# Patient Record
Sex: Female | Born: 1950 | Race: Black or African American | Hispanic: No | Smoking: Never smoker
Health system: Southern US, Community
[De-identification: ages and names within clinical notes are randomized; demographics above are authoritative.]

## PROBLEM LIST (undated history)

## (undated) DIAGNOSIS — I10 Essential (primary) hypertension: Secondary | ICD-10-CM

## (undated) DIAGNOSIS — E119 Type 2 diabetes mellitus without complications: Secondary | ICD-10-CM

## (undated) DIAGNOSIS — I509 Heart failure, unspecified: Secondary | ICD-10-CM

## (undated) HISTORY — PX: CARPAL TUNNEL RELEASE: SHX101

---

## 2022-03-10 ENCOUNTER — Other Ambulatory Visit: Payer: Self-pay

## 2022-03-10 ENCOUNTER — Emergency Department (HOSPITAL_BASED_OUTPATIENT_CLINIC_OR_DEPARTMENT_OTHER): Payer: Medicare HMO

## 2022-03-10 ENCOUNTER — Encounter (HOSPITAL_BASED_OUTPATIENT_CLINIC_OR_DEPARTMENT_OTHER): Payer: Self-pay | Admitting: Emergency Medicine

## 2022-03-10 ENCOUNTER — Emergency Department (HOSPITAL_BASED_OUTPATIENT_CLINIC_OR_DEPARTMENT_OTHER)
Admission: EM | Admit: 2022-03-10 | Discharge: 2022-03-10 | Disposition: A | Discharge status: Home / Self Care | Payer: Medicare HMO | Attending: Emergency Medicine | Admitting: Emergency Medicine

## 2022-03-10 DIAGNOSIS — S72001A Fracture of unspecified part of neck of right femur, initial encounter for closed fracture: Secondary | ICD-10-CM | POA: Insufficient documentation

## 2022-03-10 DIAGNOSIS — E119 Type 2 diabetes mellitus without complications: Secondary | ICD-10-CM | POA: Insufficient documentation

## 2022-03-10 DIAGNOSIS — S79911A Unspecified injury of right hip, initial encounter: Secondary | ICD-10-CM | POA: Diagnosis present

## 2022-03-10 DIAGNOSIS — Z950 Presence of cardiac pacemaker: Secondary | ICD-10-CM | POA: Diagnosis not present

## 2022-03-10 DIAGNOSIS — W07XXXA Fall from chair, initial encounter: Secondary | ICD-10-CM | POA: Insufficient documentation

## 2022-03-10 DIAGNOSIS — I10 Essential (primary) hypertension: Secondary | ICD-10-CM | POA: Diagnosis not present

## 2022-03-10 NOTE — Discharge Instructions (Signed)
You were seen in the emergency department for right hip pain after a fall.  You had x-rays and a CAT scan of your right hip.  There was a fracture off of the corner of the hip bone.  Please follow-up with Dr. Ophelia Charter orthopedist.  He asked that you use a walker when ambulating so you do not put as much stress on the area.  Continue the pain medication as needed.  Return if any worsening or concerning symptoms. ?

## 2022-03-10 NOTE — ED Triage Notes (Signed)
Pt states fell April 3, pain right hip since then, intermittent.  Ambulated to room with steady gait.  Pt reports taking tylenol and meloxicam and gabapentin.  Last took tylenol at 0700.   ?

## 2022-03-10 NOTE — ED Notes (Signed)
ED Provider at bedside. 

## 2022-03-10 NOTE — ED Provider Notes (Signed)
?[facility] EMERGENCY DEPARTMENT ?Provider Note ? ? ?CSN: [id number] ?Arrival date & time: 03/10/22  1232 ? ?  ? ?History ? ?Chief Complaint  ?Patient presents with  ? Hip Pain  ?  fall  ? ? ?Geroge BasemanMinnie Carlson is a 71 y.o. female.  She has a history of diabetes hypertension sick sinus syndrome with pacemaker.  She said she had a mechanical fall 11 days ago off a chair landing on her right hip.  It did not bother her initially but progressively has been more painful.  Worse with going from sitting to a standing position.  Taking over-the-counter pain medication without improvement.  No numbness or weakness.  No knee or ankle pain.  No leg swelling. ? ?The history is provided by the patient.  ?Hip Pain ?This is a new problem. The current episode started more than 1 week ago. The problem occurs daily. The problem has been gradually worsening. Pertinent negatives include no chest pain, no abdominal pain and no shortness of breath. The symptoms are aggravated by bending and walking. Nothing relieves the symptoms. She has tried rest for the symptoms. The treatment provided no relief.  ? ?  ? ?Home Medications ?Prior to Admission medications   ?Not on File  ?   ? ?Allergies    ?Patient has no known allergies.   ? ?Review of Systems   ?Review of Systems  ?Constitutional:  Negative for fever.  ?HENT:  Negative for sore throat.   ?Respiratory:  Negative for shortness of breath.   ?Cardiovascular:  Negative for chest pain.  ?Gastrointestinal:  Negative for abdominal pain.  ?Genitourinary:  Negative for dysuria.  ?Musculoskeletal:  Negative for neck pain.  ?Skin:  Negative for rash.  ?Neurological:  Negative for weakness and numbness.  ? ?Physical Exam ?Updated Vital Signs ?BP 131/85 (BP Location: Right Arm)   Pulse 91   Temp 98.2 ?F (36.8 ?C) (Oral)   Resp (!) 21   Wt 122.9 kg   SpO2 98%  ?Physical Exam ?Constitutional:   ?   Appearance: Normal appearance. She is well-developed.  ?HENT:  ?   Head: Normocephalic and  atraumatic.  ?Eyes:  ?   Conjunctiva/sclera: Conjunctivae normal.  ?Abdominal:  ?   Tenderness: There is no abdominal tenderness. There is no guarding or rebound.  ?Musculoskeletal:     ?   General: Tenderness present. No deformity.  ?   Cervical back: Neck supple.  ?   Right lower leg: No edema.  ?   Left lower leg: No edema.  ?   Comments: She has some tenderness and pain with range of motion of her right hip.  Knee and ankle nontender.  Distal neurovascular intact.  No shortening or rotation appreciated  ?Skin: ?   General: Skin is warm and dry.  ?Neurological:  ?   General: No focal deficit present.  ?   Mental Status: She is alert.  ?   GCS: GCS eye subscore is 4. GCS verbal subscore is 5. GCS motor subscore is 6.  ?   Sensory: No sensory deficit.  ?   Motor: No weakness.  ? ? ?ED Results / Procedures / Treatments   ?Labs ?(all labs ordered are listed, but only abnormal results are displayed) ?Labs Reviewed - No data to display ? ?EKG ?None ? ?Radiology ?CT Hip Right Wo Contrast ? ?Result Date: 03/10/2022 ?CLINICAL DATA:  Intermittent hip pain since falling 11 days ago. Hip fracture suspected. EXAM: CT OF THE RIGHT HIP WITHOUT  CONTRAST TECHNIQUE: Multidetector CT imaging of the right hip was performed according to the standard protocol. Multiplanar CT image reconstructions were also generated. RADIATION DOSE REDUCTION: This exam was performed according to the departmental dose-optimization program which includes automated exposure control, adjustment of the mA and/or kV according to patient size and/or use of iterative reconstruction technique. COMPARISON:  Radiographs same date.  Abdominopelvic CT 02/16/2015. FINDINGS: Bones/Joint/Cartilage The bones are well mineralized. There is evidence of a subtle nondisplaced fracture through base of the greater trochanter, best seen on the coronal and sagittal images. There is no definite extension of this fracture across the femoral neck or intertrochanteric region.  There is no dislocation. There are mild to moderate right hip degenerative changes with fragmented osteophytes. No significant hip joint effusion. Moderate degenerative changes at the visualized right sacroiliac joint. Ligaments Suboptimally assessed by CT. Muscles and Tendons No acute findings. Mild atrophy of the right gluteus musculature. No gross tendon abnormality identified. There are chronic appearing calcifications within the soft tissues adjacent to the anterior superior iliac spine. Soft tissues No evidence of periarticular hematoma, other fluid collection, foreign body or soft tissue emphysema. Mild femoral atherosclerosis. IMPRESSION: 1. Evidence of nondisplaced fracture through the base of the right greater trochanter. 2. No fracture extension across the femoral neck or intertrochanteric region identified. 3. Underlying right hip and sacroiliac degenerative changes. 4. No acute soft tissue findings. Electronically Signed   By: Carey Bullocks M.D.   On: 03/10/2022 13:51  ? ?DG Hip Unilat With Pelvis 2-3 Views Right ? ?Result Date: 03/10/2022 ?CLINICAL DATA:  Fall.  Pain.  Fall 02/27/2022.  Right hip pain. EXAM: DG HIP (WITH OR WITHOUT PELVIS) 2-3V RIGHT COMPARISON:  None available FINDINGS: There is mildly decreased bone mineralization. Mild bilateral sacroiliac joint subchondral sclerosis degenerative change right-greater-than-left. Mild bilateral femoroacetabular joint space narrowing. Moderate bilateral acetabular peripheral degenerative osteophytosis. No acute fracture is seen. No dislocation. There is linear ossification just inferior to the left femoral head-neck junction, possibly overlying the iliopsoas tendon but nonspecific. 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 No definite acute fracture is seen. Electronically Signed   By: Neita Garnet M.D.   On: 03/10/2022 13:17   ? ?Procedures ?Procedures  ? ? ?Medications Ordered in ED ?Medications - No data to  display ? ?ED Course/ Medical Decision Making/ A&P ?Clinical Course as of 03/10/22 1835  ?Fri Mar 10, 2022  ?1321 X-ray does not show any acute fracture.  Awaiting radiology reading. [MB]  ?1402 Discussed with Dr. Ophelia Charter.  He said she should probably use a walker or other protective weightbearing and follow-up in the office in a week. [MB]  ?[id number] Patient states she has a walker already.  She understands to use it and follow-up with orthopedics. [MB]  ?  ?Clinical Course User Index ?[MB] Terrilee Files, MD  ? ?                        ?Medical Decision Making ?Amount and/or Complexity of Data Reviewed ?Radiology: ordered. ? ? ?Differential diagnosis includes fracture, dislocation, contusion, tendinitis, bursitis.  X-ray does not show any acute fracture or dislocation.  Ordered CT and they do comment upon greater trochanter fracture nondisplaced.  Reviewed with orthopedics.  Patient comfortable plan for using her walker limited weightbearing and outpatient follow-up with Ortho.  Return instructions discussed. ? ? ? ? ? ? ? ?Final Clinical Impression(s) / ED Diagnoses ?Final diagnoses:  ?Closed right hip fracture, initial encounter (HCC)  ? ? ?  Rx / DC Orders ?ED Discharge Orders   ? ? None  ? ?  ? ? ?  ?Terrilee Files, MD ?03/10/22 1837 ? ?

## 2022-03-17 ENCOUNTER — Ambulatory Visit: Payer: Self-pay

## 2022-03-17 ENCOUNTER — Encounter: Payer: Self-pay | Admitting: Surgery

## 2022-03-17 ENCOUNTER — Ambulatory Visit (INDEPENDENT_AMBULATORY_CARE_PROVIDER_SITE_OTHER): Payer: Medicare HMO | Admitting: Surgery

## 2022-03-17 VITALS — BP 134/82 | HR 88 | Ht 65.0 in | Wt 271.0 lb

## 2022-03-17 DIAGNOSIS — S72114A Nondisplaced fracture of greater trochanter of right femur, initial encounter for closed fracture: Secondary | ICD-10-CM

## 2022-03-17 DIAGNOSIS — M25551 Pain in right hip: Secondary | ICD-10-CM

## 2022-03-17 NOTE — Progress Notes (Signed)
   Office Visit Note   Patient: Michele Carlson           Date of Birth: 18-Aug-1951           MRN: [id number] Visit Date: 03/17/2022              Requested by: Chiquita Loth, PA [address] [place],  [place] [address] PCP: Chiquita Loth, PA   Assessment & Plan: Visit Diagnoses:  1. Pain of right hip   2. Closed nondisplaced fracture of greater trochanter of right femur, initial encounter (HCC)     Plan: I advised patient that she must use her walker to be nonweightbearing at least x1 week until she sees Dr. Ophelia Charter for recheck.  Stressed her the importance of being compliant with my instructions.  Advised her that sometimes these fractures that are nondisplaced can heal but if this does displace that she may need ORIF.  Follow-Up Instructions: Return in about 1 week (around 03/24/2022) for WITH DR YATES FOR RECHECK RIGHT GREATER TROCHANTER FRACTURE.   Orders:  Orders Placed This Encounter  Procedures   XR HIP UNILAT W OR W/O PELVIS 2-3 VIEWS LEFT   No orders of the defined types were placed in this encounter.     Procedures: No procedures performed   Clinical Data: No additional findings.   Subjective: Chief Complaint  Patient presents with   Right Hip - Fracture    HPI Patient returns for recheck of her right hip greater trochanter fracture.  She was seen in the ED March 10, 2022.  States that she is still having pain.  She has been full weightbearing cane.  Taken Mobic, Tylenol and gabapentin.    Objective: Vital Signs: BP 134/82   Pulse 88   Ht 5\' 5"  (1.651 m)   Wt 271 lb (122.9 kg)   BMI 45.10 kg/m   Physical Exam HENT:     Head: Atraumatic.  Pulmonary:     Effort: No respiratory distress.  Musculoskeletal:     Comments: Gait is antalgic.  She does have some right lateral hip pain with internal/external rotation.  No focal motor deficits.  Neurological:     General: No focal deficit present.     Mental Status: She is alert.     Ortho  Exam  Specialty Comments:  No specialty comments available.  Imaging: No results found.   PMFS History: There are no problems to display for this patient.  History reviewed. No pertinent past medical history.  History reviewed. No pertinent family history.  History reviewed. No pertinent surgical history. Social History   Occupational History   Not on file  Tobacco Use   Smoking status: Not on file   Smokeless tobacco: Not on file  Substance and Sexual Activity   Alcohol use: Not on file   Drug use: Not on file   Sexual activity: Not on file

## 2022-03-24 ENCOUNTER — Ambulatory Visit (INDEPENDENT_AMBULATORY_CARE_PROVIDER_SITE_OTHER): Payer: Medicare HMO | Admitting: Orthopaedic Surgery

## 2022-03-24 DIAGNOSIS — S72101D Unspecified trochanteric fracture of right femur, subsequent encounter for closed fracture with routine healing: Secondary | ICD-10-CM

## 2022-03-24 NOTE — Progress Notes (Signed)
? ?Office Visit Note ?  ?Patient: Michele Carlson           ?Date of Birth: Aug 31, 1951           ?MRN: [id number] ?Visit Date: 03/24/2022 ?             ?Requested by: Chiquita Loth, PA ?[address] ?[place],  [place] [address] ?PCP: Chiquita Loth, PA ? ? ?Assessment & Plan: ?Visit Diagnoses:  ?1. Closed traumatic minimally displaced fracture of trochanter of right femur with routine healing, subsequent encounter   ? ? ?Plan: Fall prevention discussed.  Nondisplaced trochanteric fracture reviewed on CT images with patient pathophysiology discussed.  Recheck 3 weeks. ? ?Follow-Up Instructions: Return in about 3 weeks (around 04/14/2022).  ? ?Orders:  ?No orders of the defined types were placed in this encounter. ? ?No orders of the defined types were placed in this encounter. ? ? ? ? Procedures: ?No procedures performed ? ? ?Clinical Data: ?No additional findings. ? ? ?Subjective: ?Chief Complaint  ?Patient presents with  ? Right Hip - Follow-up  ? ? ?HPI 71 year old female seen for follow-up of right greater trochanteric fracture.  Originally fell February 27, 2022.  She states at times she has minimal pain.  She is used meloxicam and Tylenol for symptoms.  She is ambulating with a cane.  She also has a walker at home. ? ?Review of Systems all other systems noncontributory to HPI. ? ? ?Objective: ?Vital Signs: Ht 5\' 5"  (1.651 m)   Wt 271 lb (122.9 kg)   BMI 45.10 kg/m?  ? ?Physical Exam ?Constitutional:   ?   Appearance: She is well-developed.  ?HENT:  ?   Head: Normocephalic.  ?   Right Ear: External ear normal.  ?   Left Ear: External ear normal. There is no impacted cerumen.  ?Eyes:  ?   Pupils: Pupils are equal, round, and reactive to light.  ?Neck:  ?   Thyroid: No thyromegaly.  ?   Trachea: No tracheal deviation.  ?Cardiovascular:  ?   Rate and Rhythm: Normal rate.  ?Pulmonary:  ?   Effort: Pulmonary effort is normal.  ?Abdominal:  ?   Palpations: Abdomen is soft.  ?Musculoskeletal:  ?   Cervical back: No  rigidity.  ?Skin: ?   General: Skin is warm and dry.  ?Neurological:  ?   Mental Status: She is alert and oriented to person, place, and time.  ?Psychiatric:     ?   Behavior: Behavior normal.  ? ? ?Ortho Exam sensation is intact.  Mild discomfort with resisted abduction.  Quad strength is good.  Minimal right lower extremity limp Trendelenburg. ? ?Specialty Comments:  ?No specialty comments available. ? ?Imaging: ?Narrative & Impression  ?CLINICAL DATA:  Intermittent hip pain since falling 11 days ago. Hip ?fracture suspected. ?  ?EXAM: ?CT OF THE RIGHT HIP WITHOUT CONTRAST ?  ?TECHNIQUE: ?Multidetector CT imaging of the right hip was performed according to ?the standard protocol. Multiplanar CT image reconstructions were ?also generated. ?  ?RADIATION DOSE REDUCTION: This exam was performed according to the ?departmental dose-optimization program which includes automated ?exposure control, adjustment of the mA and/or kV according to ?patient size and/or use of iterative reconstruction technique. ?  ?COMPARISON:  Radiographs same date.  Abdominopelvic CT 02/16/2015. ?  ?FINDINGS: ?Bones/Joint/Cartilage ?  ?The bones are well mineralized. There is evidence of a subtle ?nondisplaced fracture through base of the greater trochanter, best ?seen on the coronal and sagittal images. There is no  definite ?extension of this fracture across the femoral neck or ?intertrochanteric region. There is no dislocation. There are mild to ?moderate right hip degenerative changes with fragmented osteophytes. ?No significant hip joint effusion. Moderate degenerative changes at ?the visualized right sacroiliac joint. ?  ?Ligaments ?  ?Suboptimally assessed by CT. ?  ?Muscles and Tendons ?  ?No acute findings. Mild atrophy of the right gluteus musculature. No ?gross tendon abnormality identified. There are chronic appearing ?calcifications within the soft tissues adjacent to the anterior ?superior iliac spine. ?  ?Soft tissues ?  ?No  evidence of periarticular hematoma, other fluid collection, ?foreign body or soft tissue emphysema. Mild femoral atherosclerosis. ?  ?IMPRESSION: ?1. Evidence of nondisplaced fracture through the base of the right ?greater trochanter. ?2. No fracture extension across the femoral neck or ?intertrochanteric region identified. ?3. Underlying right hip and sacroiliac degenerative changes. ?4. No acute soft tissue findings. ?  ?  ?Electronically Signed ?  By: Carey Bullocks M.D. ?  On: 03/10/2022 13:51  ? ? ? ?PMFS History: ?Patient Active Problem List  ? Diagnosis Date Noted  ? Traumatic closed trochanteric fracture of right femur with minimal displacement (HCC) 03/25/2022  ? ?No past medical history on file.  ?No family history on file.  ?No past surgical history on file. ?Social History  ? ?Occupational History  ? Not on file  ?Tobacco Use  ? Smoking status: Not on file  ? Smokeless tobacco: Not on file  ?Substance and Sexual Activity  ? Alcohol use: Not on file  ? Drug use: Not on file  ? Sexual activity: Not on file  ? ? ? ? ? ? ?

## 2022-03-25 DIAGNOSIS — S72101A Unspecified trochanteric fracture of right femur, initial encounter for closed fracture: Secondary | ICD-10-CM | POA: Insufficient documentation

## 2022-04-12 ENCOUNTER — Ambulatory Visit (INDEPENDENT_AMBULATORY_CARE_PROVIDER_SITE_OTHER): Payer: Medicare HMO | Admitting: Orthopaedic Surgery

## 2022-04-12 ENCOUNTER — Ambulatory Visit: Payer: Self-pay

## 2022-04-12 VITALS — BP 153/83 | HR 90 | Ht 65.0 in | Wt 271.0 lb

## 2022-04-12 DIAGNOSIS — S72101D Unspecified trochanteric fracture of right femur, subsequent encounter for closed fracture with routine healing: Secondary | ICD-10-CM | POA: Diagnosis not present

## 2022-04-12 NOTE — Progress Notes (Signed)
? ?  Office Visit Note ?  ?Patient: Michele Carlson           ?Date of Birth: [birth year]           ?MRN: [id number] ?Visit Date: 04/12/2022 ?             ?Requested by: Chiquita Loth, PA ?[address] ?[place],   [id number] ?PCP: Chiquita Loth, PA ? ? ?Assessment & Plan: ?Visit Diagnoses:  ?1. Closed traumatic minimally displaced fracture of trochanter of right femur with routine healing, subsequent encounter   ? ? ?Plan: Increase walking using her walker for fall prevention with weightbearing as tolerated.  Return with single AP x-ray right hip in 1 month. ? ?Follow-Up Instructions: Return in about 1 month (around 05/13/2022).  ? ?Orders:  ?Orders Placed This Encounter  ?Procedures  ? XR HIP UNILAT W OR W/O PELVIS 2-3 VIEWS RIGHT  ? ?No orders of the defined types were placed in this encounter. ? ? ? ? Procedures: ?No procedures performed ? ? ?Clinical Data: ?No additional findings. ? ? ?Subjective: ?Chief Complaint  ?Patient presents with  ? Right Hip - Follow-up, Fracture  ?  Fall 02/27/2022  ? ? ?HPI now 6 weeks post fall with right greater trochanteric fracture nondisplaced.  Fall date/3/23.  Patient 6% pain relief she hangs on the wall when she walks in the house she still has a walker ? ?Review of Systems updated unchanged. ? ? ?Objective: ?Vital Signs: BP (!) 153/83   Pulse 90   Ht 5\' 5"  (1.651 m)   Wt 271 lb (122.9 kg)   BMI 45.10 kg/m?  ? ?Physical Exam unchanged from 03/24/2022 exam. ? ?Ortho Exam mild swelling lower extremity she has compression stockings at home she could use.  Pulses are normal negative logroll to the hips.  Some pain with resisted abduction right hip. ? ?Specialty Comments:  ?No specialty comments available. ? ?Imaging: ?No results found. ? ? ?PMFS History: ?Patient Active Problem List  ? Diagnosis Date Noted  ? Traumatic closed trochanteric fracture of right femur with minimal displacement (HCC) 03/25/2022  ? ?No past medical history on file.  ?No family history on file.   ?No past surgical history on file. ?Social History  ? ?Occupational History  ? Not on file  ?Tobacco Use  ? Smoking status: Not on file  ? Smokeless tobacco: Not on file  ?Substance and Sexual Activity  ? Alcohol use: Not on file  ? Drug use: Not on file  ? Sexual activity: Not on file  ? ? ? ? ? ? ?

## 2022-05-16 ENCOUNTER — Ambulatory Visit (INDEPENDENT_AMBULATORY_CARE_PROVIDER_SITE_OTHER): Payer: Medicare HMO | Admitting: Orthopaedic Surgery

## 2022-05-16 ENCOUNTER — Ambulatory Visit (INDEPENDENT_AMBULATORY_CARE_PROVIDER_SITE_OTHER): Payer: Medicare HMO

## 2022-05-16 ENCOUNTER — Encounter: Payer: Self-pay | Admitting: Orthopaedic Surgery

## 2022-05-16 VITALS — BP 111/73 | HR 83 | Ht 65.0 in | Wt 271.0 lb

## 2022-05-16 DIAGNOSIS — S72101D Unspecified trochanteric fracture of right femur, subsequent encounter for closed fracture with routine healing: Secondary | ICD-10-CM

## 2022-05-16 DIAGNOSIS — Z6841 Body Mass Index (BMI) 40.0 and over, adult: Secondary | ICD-10-CM | POA: Diagnosis not present

## 2022-05-16 NOTE — Progress Notes (Signed)
Office Visit Note   Patient: Michele Carlson           Date of Birth: 1951/02/09           MRN: [id number] Visit Date: 05/16/2022              Requested by: Chiquita Loth, PA [address] [place],  [place] [address] PCP: Chiquita Loth, PA   Assessment & Plan: Visit Diagnoses:  1. Closed traumatic minimally displaced fracture of trochanter of right femur with routine healing, subsequent encounter   2. Body mass index 45.0-49.9, adult (HCC)     Plan: Recheck 3 months.  She does have hip osteoarthritis bilaterally.The patient meets the AMA guidelines for Morbid (severe) obesity with a BMI > 40.0 and I have recommended weight loss.    Follow-Up Instructions: Return in about 3 months (around 08/16/2022).   Orders:  Orders Placed This Encounter  Procedures   XR HIP UNILAT W OR W/O PELVIS 1V RIGHT   No orders of the defined types were placed in this encounter.     Procedures: No procedures performed   Clinical Data: No additional findings.   Subjective: Chief Complaint  Patient presents with   Right Hip - Fracture, Follow-up    HPI follow-up right nondisplaced trochanteric fracture from a fall on 02/27/2022.  Fracture was seen on CT scan.  She has had problems with moderate hip osteoarthritis bilaterally and had been limping some.  She had 2 falls one in February and 1 in April.  She has a cane at home but today she does not have her cane with her she states she has been holding onto furniture or the walls or the appliances when she walks in her house.  She has a daughter with significant rheumatoid arthritis that she takes care of at home.  Review of Systems positive for BMI 45 all other systems noncontributory HPI.   Objective: Vital Signs: BP 111/73   Pulse 83   Ht 5\' 5"  (1.651 m)   Wt 271 lb (122.9 kg)   BMI 45.10 kg/m   Physical Exam Constitutional:      Appearance: She is well-developed.  HENT:     Head: Normocephalic.     Right Ear: External ear  normal.     Left Ear: External ear normal. There is no impacted cerumen.  Eyes:     Pupils: Pupils are equal, round, and reactive to light.  Neck:     Thyroid: No thyromegaly.     Trachea: No tracheal deviation.  Cardiovascular:     Rate and Rhythm: Normal rate.  Pulmonary:     Effort: Pulmonary effort is normal.  Abdominal:     Palpations: Abdomen is soft.  Musculoskeletal:     Cervical back: No rigidity.  Skin:    General: Skin is warm and dry.  Neurological:     Mental Status: She is alert and oriented to person, place, and time.  Psychiatric:        Behavior: Behavior normal.     Ortho Exam positive bilateral Trendelenburg gait.  Pain with internal/external rotation of both hips.  Specialty Comments:  No specialty comments available.  Imaging: XR HIP UNILAT W OR W/O PELVIS 1V RIGHT  Result Date: 05/16/2022 Single view right hip obtained and reviewed there is hip osteoarthritis moderate unchanged from previous images in April 2023.  The nondisplaced trochanteric fracture is not visualized but there is some sclerosis consistent with interval healing.  Greater trochanter is nondisplaced.  Impression: Apparent healing right greater trochanteric nondisplaced fracture.    PMFS History: Patient Active Problem List   Diagnosis Date Noted   Traumatic closed trochanteric fracture of right femur with minimal displacement (HCC) 03/25/2022   History reviewed. No pertinent past medical history.  History reviewed. No pertinent family history.  History reviewed. No pertinent surgical history. Social History   Occupational History   Not on file  Tobacco Use   Smoking status: Not on file   Smokeless tobacco: Not on file  Substance and Sexual Activity   Alcohol use: Not on file   Drug use: Not on file   Sexual activity: Not on file

## 2022-06-16 ENCOUNTER — Emergency Department (HOSPITAL_BASED_OUTPATIENT_CLINIC_OR_DEPARTMENT_OTHER): Payer: Medicare HMO

## 2022-06-16 ENCOUNTER — Other Ambulatory Visit: Payer: Self-pay

## 2022-06-16 ENCOUNTER — Encounter (HOSPITAL_BASED_OUTPATIENT_CLINIC_OR_DEPARTMENT_OTHER): Payer: Self-pay

## 2022-06-16 ENCOUNTER — Emergency Department (HOSPITAL_BASED_OUTPATIENT_CLINIC_OR_DEPARTMENT_OTHER)
Admission: EM | Admit: 2022-06-16 | Discharge: 2022-06-16 | Disposition: A | Discharge status: Home / Self Care | Payer: Medicare HMO | Attending: Emergency Medicine | Admitting: Emergency Medicine

## 2022-06-16 DIAGNOSIS — M5431 Sciatica, right side: Secondary | ICD-10-CM

## 2022-06-16 DIAGNOSIS — Z5321 Procedure and treatment not carried out due to patient leaving prior to being seen by health care provider: Secondary | ICD-10-CM | POA: Diagnosis not present

## 2022-06-16 DIAGNOSIS — M5136 Other intervertebral disc degeneration, lumbar region: Secondary | ICD-10-CM | POA: Insufficient documentation

## 2022-06-16 DIAGNOSIS — M545 Low back pain, unspecified: Secondary | ICD-10-CM | POA: Diagnosis present

## 2022-06-16 DIAGNOSIS — M51369 Other intervertebral disc degeneration, lumbar region without mention of lumbar back pain or lower extremity pain: Secondary | ICD-10-CM

## 2022-06-16 HISTORY — DX: Type 2 diabetes mellitus without complications: E11.9

## 2022-06-16 HISTORY — DX: Heart failure, unspecified: I50.9

## 2022-06-16 HISTORY — DX: Essential (primary) hypertension: I10

## 2022-06-16 MED ORDER — PREDNISONE 10 MG (21) PO TBPK: ORAL_TABLET | Freq: Every day | ORAL | 0 refills | Status: AC

## 2022-06-16 MED ORDER — OXYCODONE-ACETAMINOPHEN 5-325 MG PO TABS
1.0000 | ORAL_TABLET | Freq: Once | ORAL | Status: AC
  Administered 2022-06-16: 1 via ORAL
  Filled 2022-06-16: qty 1

## 2022-06-16 MED ORDER — METHOCARBAMOL 500 MG PO TABS: 500.0000 mg | ORAL_TABLET | Freq: Two times a day (BID) | ORAL | 0 refills | Status: AC

## 2022-06-16 NOTE — ED Notes (Signed)
Patient transported to X-ray 

## 2022-06-16 NOTE — ED Notes (Signed)
ED Provider at bedside. 

## 2022-06-16 NOTE — ED Provider Notes (Signed)
[facility] EMERGENCY DEPARTMENT Provider Note   CSN: [id number] Arrival date & time: 06/16/22  1219     History  Chief Complaint  Patient presents with   Leg Pain    Michele Carlson is a 71 y.o. female with history of a closed right hip fracture that occurred in April 2023 that did not require surgery presents to the emergency department for evaluation of low back pain and right hip pain that started about 1 week ago.  Patient states that she has been following with Dr. Ophelia Charter for her femur fracture.  Per chart review at that time she was also noted to have moderate hip osteoarthritis bilaterally.  She was doing well at her follow-up appointment 1 month ago and was advised to have her return again in 3 months.  Patient denies new injury or trauma.  Pain has been coming on gradually and she endorses pain that shoots down the back of her leg that she describes as sharp/shocking.  Pain is worsened when bending over and with certain movements.  No treatment prior to arrival.  She is ambulatory although with difficulty.  She also endorses some numbness of the hip as well.  Denies fever, chills, nausea, vomiting and all other systemic complaints.   Leg Pain Associated symptoms: back pain   Associated symptoms: no fever    Leg Pain Associated symptoms: back pain   Associated symptoms: no fever    Leg Pain Associated symptoms: back pain        Home Medications Prior to Admission medications   Medication Sig Start Date End Date Taking? Authorizing Provider  methocarbamol (ROBAXIN) 500 MG tablet Take 1 tablet (500 mg total) by mouth 2 (two) times daily. 06/16/22  Yes Raynald Blend R, PA-C  predniSONE (STERAPRED UNI-PAK 21 TAB) 10 MG (21) TBPK tablet Take by mouth daily. Take 6 tabs by mouth daily  for 2 days, then 5 tabs for 2 days, then 4 tabs for 2 days, then 3 tabs for 2 days, 2 tabs for 2 days, then 1 tab by mouth daily for 2 days 06/16/22  Yes Raynald Blend R, PA-C  amLODipine  (NORVASC) 10 MG tablet Take 10 mg by mouth daily. 03/04/22   [provider]  carvedilol (COREG) 25 MG tablet Take 50 mg by mouth 2 (two) times daily. 03/04/22   [provider]  famotidine (PEPCID) 20 MG tablet Take by mouth. 03/23/22   [provider]  gabapentin (NEURONTIN) 300 MG capsule Take 300 mg by mouth 3 (three) times daily. 01/10/22   [provider]  JARDIANCE 25 MG TABS tablet Take 25 mg by mouth daily. 02/15/22   [provider]  losartan (COZAAR) 100 MG tablet Take 100 mg by mouth daily. 03/04/22   [provider]  meloxicam (MOBIC) 15 MG tablet Take 15 mg by mouth daily. 02/15/22   [provider]  metFORMIN (GLUCOPHAGE) 1000 MG tablet Take 1,000 mg by mouth 2 (two) times daily. 02/01/22   [provider]  NOVOLOG MIX 70/30 FLEXPEN (70-30) 100 UNIT/ML FlexPen Inject into the skin. 03/23/22   [provider]  omeprazole (PRILOSEC) 20 MG capsule Take 20 mg by mouth 2 (two) times daily. 02/01/22   [provider]  rosuvastatin (CRESTOR) 40 MG tablet Take by mouth. 04/06/22   [provider]      Allergies    Pregabalin and Ace inhibitors    Review of Systems   Review of Systems  Constitutional:  Negative  for fever.  Musculoskeletal:  Positive for arthralgias, back pain and gait problem.  Neurological:  Negative for weakness.    Physical Exam Updated Vital Signs BP 116/85 (BP Location: Right Wrist)   Pulse 66   Temp 98.7 F (37.1 C) (Oral)   Resp 18   Ht 5\' 5"  (1.651 m)   Wt 127 kg   SpO2 98%   BMI 46.59 kg/m  Physical Exam Vitals and nursing note reviewed.  Constitutional:      General: She is not in acute distress.    Appearance: She is obese. She is not ill-appearing.  HENT:     Head: Atraumatic.  Eyes:     Conjunctiva/sclera: Conjunctivae normal.  Cardiovascular:     Rate and Rhythm: Normal rate and regular rhythm.     Pulses: Normal pulses.          Radial pulses are 2+  on the right side and 2+ on the left side.       Dorsalis pedis pulses are 2+ on the right side and 2+ on the left side.     Heart sounds: No murmur heard. Pulmonary:     Effort: Pulmonary effort is normal. No respiratory distress.     Breath sounds: Normal breath sounds.  Abdominal:     General: Abdomen is flat. There is no distension.     Palpations: Abdomen is soft.     Tenderness: There is no abdominal tenderness.  Musculoskeletal:     Cervical back: Normal range of motion.     Right lower leg: No edema.     Left lower leg: No edema.     Comments: Midline tenderness to palpation of the lumbar spine.  Some tenderness noted to the right hip without palpable deformity.  Positive straight leg raise on right side.  Skin:    General: Skin is warm and dry.     Capillary Refill: Capillary refill takes less than 2 seconds.  Neurological:     General: No focal deficit present.     Mental Status: She is alert.     Comments: Subjective sensation intact bilaterally.  Psychiatric:        Mood and Affect: Mood normal.     ED Results / Procedures / Treatments   Labs (all labs ordered are listed, but only abnormal results are displayed) Labs Reviewed - No data to display  EKG None  Radiology DG Femur Min 2 Views Right  Result Date: 06/16/2022 CLINICAL DATA:  Acute right hip pain.  No reported injury. EXAM: RIGHT FEMUR 2 VIEWS COMPARISON:  May 16, 2022. FINDINGS: There is no evidence of fracture or other focal bone lesions. Soft tissues are unremarkable. IMPRESSION: Negative. Electronically Signed   By: May 18, 2022 M.D.   On: 06/16/2022 15:41   DG Lumbar Spine Complete  Result Date: 06/16/2022 CLINICAL DATA:  Low back pain radiating down the right leg. EXAM: LUMBAR SPINE - COMPLETE 4+ VIEW COMPARISON:  CT abdomen pelvis dated February 16, 2015. FINDINGS: Five lumbar type vertebral bodies. No acute fracture or subluxation. Vertebral body heights are preserved. Unchanged 6 mm  anterolisthesis at L4-L5. Moderate L2-L3 and severe L3-L4 disc height loss, progressed since 2016. Unchanged mild L4-L5 and moderate L5-S1 disc height loss. Unchanged severe facet arthropathy at L4-L5. IMPRESSION: 1. No acute osseous abnormality. 2. Multilevel lumbar spondylosis as described above, progressed since 2016. Electronically Signed   By: 2017 M.D.   On: 06/16/2022 15:34    Procedures Procedures  Medications Ordered in ED Medications  oxyCODONE-acetaminophen (PERCOCET/ROXICET) 5-325 MG per tablet 1 tablet (1 tablet Oral Given 06/16/22 1424)    ED Course/ Medical Decision Making/ A&P                           Medical Decision Making Amount and/or Complexity of Data Reviewed Radiology: ordered.  Risk Prescription drug management.   71 year old female presents to the emergency department for evaluation of low back pain and right hip pain x1 week.  Differentials include fracture, dislocation, degenerative disc disease with sciatica, muscle spasms.  Vitals are without significant abnormality.  Physical exam findings as described above.  Concerning for lower lumbar tenderness and positive right-sided straight leg raise.  She was given Percocet here in the emergency department.  I ordered x-ray of the lumbar spine and of the femur, however per RT, they were having trouble getting patient to lie down in order to complete the exam.  They were only able to get 2 images of her in standing position but were unable to obtain the rest of the lumbar imaging and the femur imaging.  Patient had only just gotten her Percocet, so we will give more time before attempting to repeat the imaging.  After 30 minutes, patient's pain was well controlled enough that we were able to obtain imaging.  No acute fractures or dislocations noted.  DDD of the lumbar spine appears acutely worsened when compared to imaging done in 2016.  This is likely the cause of her now sciatic symptoms.  She does have  orthopedic care already established.  She can follow-up with PCP or orthopedics to have this managed long-term.  Advised NSAIDs.  I have also sent in prescription of Robaxin and a steroid Dosepak.  Return precautions discussed.  Discharged home in good condition. Final Clinical Impression(s) / ED Diagnoses Final diagnoses:  Sciatica of right side  DDD (degenerative disc disease), lumbar    Rx / DC Orders ED Discharge Orders          Ordered    methocarbamol (ROBAXIN) 500 MG tablet  2 times daily        06/16/22 1600    predniSONE (STERAPRED UNI-PAK 21 TAB) 10 MG (21) TBPK tablet  Daily        06/16/22 1600              Janell Quiet, [place] 06/16/22 1619    Terrilee Files, MD 06/16/22 1734

## 2022-06-16 NOTE — Discharge Instructions (Signed)
Your imaging today was fortunately negative for fractures or reinjury of your femur.  It does seem that you have worsening degenerative disc disease which is likely what is causing your sciatica.  Continue taking over-the-counter Tylenol or Motrin for some of your pain and discomfort.  I have also sent you in a steroid Dosepak and a muscle relaxer that may help with some of your pain.  Please follow-up with your PCP or your orthopedic physician for long-term management as this will likely be a chronic issue.  Please note that the muscle relaxer can cause  drowsiness.

## 2022-06-16 NOTE — ED Triage Notes (Signed)
Patient c/o bilateral lower right shooting pain starting from hip down the back of the leg. Patient states it started hurting worse Tuesday.

## 2022-08-14 ENCOUNTER — Emergency Department (HOSPITAL_BASED_OUTPATIENT_CLINIC_OR_DEPARTMENT_OTHER): Payer: Medicare HMO

## 2022-08-14 ENCOUNTER — Other Ambulatory Visit: Payer: Self-pay

## 2022-08-14 ENCOUNTER — Encounter (HOSPITAL_BASED_OUTPATIENT_CLINIC_OR_DEPARTMENT_OTHER): Payer: Self-pay | Admitting: Emergency Medicine

## 2022-08-14 ENCOUNTER — Emergency Department (HOSPITAL_BASED_OUTPATIENT_CLINIC_OR_DEPARTMENT_OTHER)
Admission: EM | Admit: 2022-08-14 | Discharge: 2022-08-14 | Disposition: A | Discharge status: Home / Self Care | Payer: Medicare HMO | Attending: Emergency Medicine | Admitting: Emergency Medicine

## 2022-08-14 DIAGNOSIS — I10 Essential (primary) hypertension: Secondary | ICD-10-CM | POA: Insufficient documentation

## 2022-08-14 DIAGNOSIS — R531 Weakness: Secondary | ICD-10-CM | POA: Diagnosis not present

## 2022-08-14 DIAGNOSIS — E119 Type 2 diabetes mellitus without complications: Secondary | ICD-10-CM | POA: Insufficient documentation

## 2022-08-14 DIAGNOSIS — Z79899 Other long term (current) drug therapy: Secondary | ICD-10-CM | POA: Diagnosis not present

## 2022-08-14 DIAGNOSIS — Z794 Long term (current) use of insulin: Secondary | ICD-10-CM | POA: Insufficient documentation

## 2022-08-14 DIAGNOSIS — Z950 Presence of cardiac pacemaker: Secondary | ICD-10-CM | POA: Diagnosis not present

## 2022-08-14 DIAGNOSIS — Z7984 Long term (current) use of oral hypoglycemic drugs: Secondary | ICD-10-CM | POA: Diagnosis not present

## 2022-08-14 DIAGNOSIS — M25551 Pain in right hip: Secondary | ICD-10-CM | POA: Diagnosis not present

## 2022-08-14 DIAGNOSIS — M25511 Pain in right shoulder: Secondary | ICD-10-CM

## 2022-08-14 DIAGNOSIS — M5441 Lumbago with sciatica, right side: Secondary | ICD-10-CM | POA: Diagnosis present

## 2022-08-14 MED ORDER — NAPROXEN 375 MG PO TABS: 375.0000 mg | ORAL_TABLET | Freq: Two times a day (BID) | ORAL | 0 refills | Status: AC

## 2022-08-14 MED ORDER — KETOROLAC TROMETHAMINE 30 MG/ML IJ SOLN
30.0000 mg | Freq: Once | INTRAMUSCULAR | Status: AC
  Administered 2022-08-14: 30 mg via INTRAMUSCULAR
  Filled 2022-08-14: qty 1

## 2022-08-14 NOTE — Discharge Instructions (Addendum)
You have been seen today for your complaint of right-sided back pain, hip pain and shoulder pain. Your imaging showed no fractures, did show rotator cuff injuries to the right shoulder. Your discharge medications include Naprosyn.  This is an NSAID.  You should take it twice a day for the next 10 days. Home care instructions are as follows:  You should rest.  You should move slowly when changing positions.  You should use your walker at home. Follow up with: Your primary care provider in 1 week.  You should also follow-up with your orthopedic provider in 1 week. Please seek immediate medical care if you develop any of the following symptoms: Your arm, hand, or fingers: Tingle. Are numb. Are swollen. Are painful. Turn white or blue. You cannot control when you pee (urinate) or poop (have a bowel movement). You have weakness in any of these areas and it gets worse: Lower back. The area between your hip bones. Butt. Legs. You have redness or swelling of your back. You have a burning feeling when you pee. At this time there does not appear to be the presence of an emergent medical condition, however there is always the potential for conditions to change. Please read and follow the below instructions.  Do not take your medicine if  develop an itchy rash, swelling in your mouth or lips, or difficulty breathing; call 911 and seek immediate emergency medical attention if this occurs.  You may review your lab tests and imaging results in their entirety on your MyChart account.  Please discuss all results of fully with your primary care provider and other specialist at your follow-up visit.  Note: Portions of this text may have been transcribed using voice recognition software. Every effort was made to ensure accuracy; however, inadvertent computerized transcription errors may still be present.

## 2022-08-14 NOTE — ED Notes (Signed)
Patient transported to CT 

## 2022-08-14 NOTE — ED Triage Notes (Signed)
Patient presents to ED via [facility] from home. Here with chronic lower back pain x 3 month.

## 2022-08-14 NOTE — ED Provider Notes (Signed)
[facility] EMERGENCY DEPARTMENT Provider Note   CSN: [id number] Arrival date & time: 08/14/22  1612     History  Chief Complaint  Patient presents with   Back Pain    Geroge BasemanMinnie Carlson is a 71 y.o. female.  With a history of diabetes, hypertension, sick sinus syndrome with a pacemaker who presents ED for evaluation of a fall.  She reports she fell earlier this afternoon.  Medical record.  She states she tried to sit down on her chair and slid down onto her butt.  She is complaining of lumbar pain, right hip pain, and right shoulder pain.  She has had low back pain and right hip pain for the past 3 weeks, but today's fall made the pain worse.  Patient states she woke up yesterday and had intense right shoulder pain with decreased range of motion.  Patient does have diabetic neuropathy and reports no increase in her numbness or tingling.  She states she is weak at baseline.  Denies hitting her head.  Denies loss of consciousness.  Patient reports she is unable to walk at home currently due to pain.  Patient has been evaluated department back pain with right-sided sciatica numerous times in the past 2 months.    Back Pain Associated symptoms: weakness        Home Medications Prior to Admission medications   Medication Sig Start Date End Date Taking? Authorizing Provider  amLODipine (NORVASC) 10 MG tablet Take 10 mg by mouth daily. 03/04/22   [provider]  carvedilol (COREG) 25 MG tablet Take 50 mg by mouth 2 (two) times daily. 03/04/22   [provider]  famotidine (PEPCID) 20 MG tablet Take by mouth. 03/23/22   [provider]  gabapentin (NEURONTIN) 300 MG capsule Take 300 mg by mouth 3 (three) times daily. 01/10/22   [provider]  JARDIANCE 25 MG TABS tablet Take 25 mg by mouth daily. 02/15/22   [provider]  losartan (COZAAR) 100 MG tablet Take 100 mg by mouth daily. 03/04/22   [provider]  meloxicam (MOBIC) 15 MG  tablet Take 15 mg by mouth daily. 02/15/22   [provider]  metFORMIN (GLUCOPHAGE) 1000 MG tablet Take 1,000 mg by mouth 2 (two) times daily. 02/01/22   [provider]  methocarbamol (ROBAXIN) 500 MG tablet Take 1 tablet (500 mg total) by mouth 2 (two) times daily. 06/16/22   Raynald Blendonklin, Erica R, PA-C  NOVOLOG MIX 70/30 FLEXPEN (70-30) 100 UNIT/ML FlexPen Inject into the skin. 03/23/22   [provider]  omeprazole (PRILOSEC) 20 MG capsule Take 20 mg by mouth 2 (two) times daily. 02/01/22   [provider]  predniSONE (STERAPRED UNI-PAK 21 TAB) 10 MG (21) TBPK tablet Take by mouth daily. Take 6 tabs by mouth daily  for 2 days, then 5 tabs for 2 days, then 4 tabs for 2 days, then 3 tabs for 2 days, 2 tabs for 2 days, then 1 tab by mouth daily for 2 days 06/16/22   Janell Quietonklin, Erica R, PA-C  rosuvastatin (CRESTOR) 40 MG tablet Take by mouth. 04/06/22   [provider]      Allergies    Pregabalin and Ace inhibitors    Review of Systems   Review of Systems  Musculoskeletal:  Positive for arthralgias, back pain and myalgias.  Neurological:  Positive for weakness.  All other systems reviewed and are negative.   Physical Exam Updated Vital Signs BP 107/62   Pulse  94   Temp 99.7 F (37.6 C)   Resp 18   SpO2 99%  Physical Exam Vitals and nursing note reviewed.  Constitutional:      General: She is not in acute distress.    Appearance: Normal appearance. She is normal weight. She is not ill-appearing or toxic-appearing.  HENT:     Head: Normocephalic and atraumatic.  Cardiovascular:     Pulses: Normal pulses.     Heart sounds: Normal heart sounds.  Pulmonary:     Effort: Pulmonary effort is normal. No respiratory distress.     Breath sounds: Normal breath sounds.  Abdominal:     General: Abdomen is flat.  Musculoskeletal:        General: Tenderness (Right hip, lumbar spine, right shoulder) present.     Cervical back: Neck supple.     Comments: No  obvious shortening or external rotation of either leg.  Patient does have tenderness palpation of the right hip, right shoulder and lumbar spine.  Patient does have decreased range of motion to the right shoulder.  Neurovascular status of all extremities intact.  Straight leg raise positive on the right side  Skin:    General: Skin is warm and dry.  Neurological:     General: No focal deficit present.     Mental Status: She is alert and oriented to person, place, and time.  Psychiatric:        Mood and Affect: Mood normal.        Behavior: Behavior normal.     ED Results / Procedures / Treatments   Labs (all labs ordered are listed, but only abnormal results are displayed) Labs Reviewed - No data to display  EKG None  Radiology DG Hip Unilat With Pelvis 2-3 Views Right  Result Date: 08/14/2022 CLINICAL DATA:  Chronic low back pain for 3 months EXAM: DG HIP (WITH OR WITHOUT PELVIS) 2-3V RIGHT COMPARISON:  04/12/2022, 06/16/2022 FINDINGS: Frontal view of the pelvis as well as frontal and frogleg lateral views of the right hip are obtained. No acute fracture, subluxation, or dislocation. Mild symmetrical bilateral hip osteoarthritis. Remainder of the bony pelvis is unremarkable. Mild lower lumbar degenerative changes, stable. IMPRESSION: 1. Stable mild bilateral hip osteoarthritis. No acute bony abnormality. 2. Stable lower lumbar degenerative changes. Electronically Signed   By: Sharlet Salina M.D.   On: 08/14/2022 19:25   DG Lumbar Spine Complete  Result Date: 08/14/2022 CLINICAL DATA:  Chronic low back pain x3 months EXAM: LUMBAR SPINE - COMPLETE 4+ VIEW COMPARISON:  06/16/2022 FINDINGS: There is mild first-degree anterolisthesis at L4-L5 level. No recent fracture is seen. Degenerative changes are noted with disc space narrowing, bony spurs and facet hypertrophy at multiple levels, more so at L3-L4 and L5-S1 levels. Paraspinal soft tissues are unremarkable. No significant interval changes  are noted. IMPRESSION: No recent fracture is seen. There is mild anterolisthesis at L4-L5 level. Lumbar spondylosis, more severe at L3-L4 and L5-S1 levels. Overall, no significant interval changes are noted. Electronically Signed   By: Ernie Avena M.D.   On: 08/14/2022 19:24    Procedures Procedures    Medications Ordered in ED Medications  ketorolac (TORADOL) 30 MG/ML injection 30 mg (30 mg Intramuscular Given 08/14/22 1919)    ED Course/ Medical Decision Making/ A&P Clinical Course as of 08/14/22 2200  Mon Aug 14, 2022  2006 I personally observed patient ambulate up and down the hall without difficulty using a walker. [AS]  2012 DG Shoulder Right X-ray shows  lateral lucencies suggestive of fracture.  We will get CT for further evaluation [AS]    Clinical Course User Index [AS] Yevette Knust, Edsel Petrin, PA-C                           Medical Decision Making Amount and/or Complexity of Data Reviewed Radiology: ordered.  Risk Prescription drug management.  This patient presents to the ED for concern of fall, right hip pain, low back pain, right shoulder pain, this involves an extensive number of treatment options, and is a complaint that carries with it a high risk of complications and morbidity.  The differential diagnosis includes fracture, contusion, strain, sprain   Co morbidities that complicate the patient evaluation  Chronic low back pain with sciatica, diabetes, hypertension, sick sinus syndrome with a pacemaker  My initial workup includes x-ray right hip, x-ray lumbar spine, x-ray right shoulder  Additional history obtained from: Nursing notes from this visit. Previous records within EMR system multiple visits recently for low back pain with right-sided sciatica Family son is present provides portion of the history EMS provides a portion of the history  I ordered imaging studies including x-ray right hip, lumbar spine, shoulder, CT right shoulder I independently  visualized and interpreted imaging which showed no fractures.  CT did show rotator cuff injuries of the right shoulder. I agree with the radiologist interpretation  Afebrile, hemodynamically stable.  Patient is a 71 year old female with a history significant for chronic low back pain.  She presents ED for evaluation of a fall.  She slid down her chair and landed on her buttocks.  Complaining of right hip pain, low back pain, right shoulder pain since the incident.  Occurred just prior to arrival.  There is some tenderness to the right hip, lumbar spine, and right shoulder.  Range of motion of the right shoulder is limited.  X-rays negative for fractures.  CT of the right shoulder did show rotator cuff injuries.  Likely the cause of her decreased range of motion.  Patient was given IM Toradol while in the ED and reported moderate relief of her pain.  She was able to sleep after receiving the Toradol.  Patient already follows with orthopedics.  She stated she will call them to schedule an appointment.  Patient was ambulatory in the ED with a walker prior to discharge.  She does use a walker at home.  She will also be given a prescription for Naprosyn and instructed to take it twice a day for pain and to not take her meloxicam while taking this medication.  Stable at discharge.  At this time there does not appear to be any evidence of an acute emergency medical condition and the patient appears stable for discharge with appropriate outpatient follow up. Diagnosis was discussed with patient who verbalizes understanding of care plan and is agreeable to discharge. I have discussed return precautions with patient and grandson who verbalizes understanding. Patient encouraged to follow-up with their PCP within 1 week. All questions answered.  Patient's case discussed with Dr. Rhunette Croft who agrees with plan to discharge with follow-up.   Note: Portions of this report may have been transcribed using voice recognition  software. Every effort was made to ensure accuracy; however, inadvertent computerized transcription errors may still be present.          Final Clinical Impression(s) / ED Diagnoses Final diagnoses:  None    Rx / DC Orders ED Discharge Orders  None         Nehemiah Massed 08/14/22 2206    Varney Biles, MD 08/15/22 1635

## 2022-08-16 ENCOUNTER — Ambulatory Visit: Payer: Medicare HMO | Admitting: Orthopaedic Surgery

## 2022-11-04 IMAGING — CT CT HIP*R* W/O CM
2 of 3 series · 17 of 46 positions shown, 19 images · non-contrast
Comparison: Radiographs same date.  Abdominopelvic CT 02/16/2015.

CLINICAL DATA: Intermittent hip pain since falling 11 days ago. Hip
fracture suspected.

EXAM:
CT OF THE RIGHT HIP WITHOUT CONTRAST
TECHNIQUE: Multidetector CT imaging of the right hip was performed according to
the standard protocol. Multiplanar CT image reconstructions were
also generated.
RADIATION DOSE REDUCTION: This exam was performed according to the
departmental dose-optimization program which includes automated
exposure control, adjustment of the mA and/or kV according to
patient size and/or use of iterative reconstruction technique.

[Series 5: axial soft tissue · axial · 0.36mm/px · z∈[-267,-131]mm · 14 of 77 slices shown, 16 images]
[im 5/77  soft-tissue]
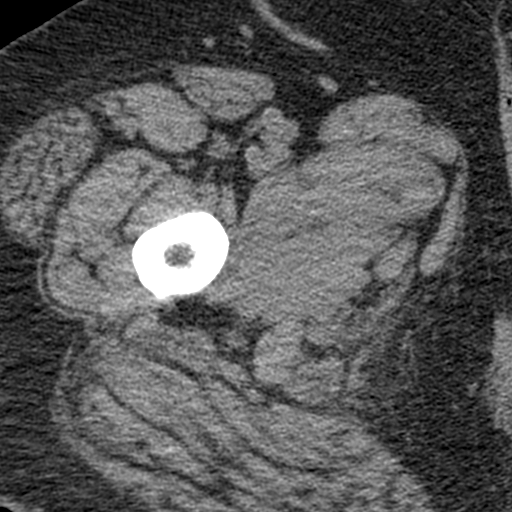
[im 5/77  bone]
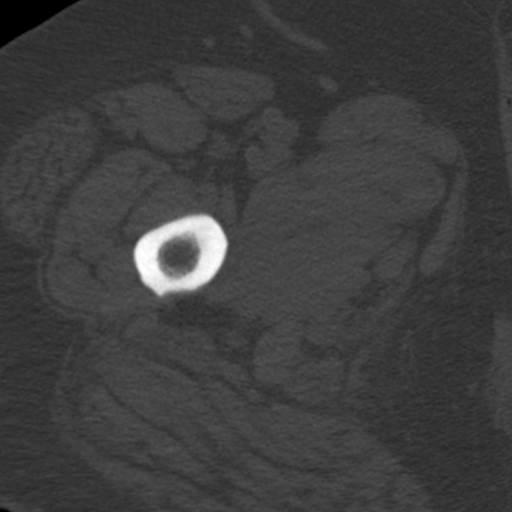
[im 10/77  soft-tissue]
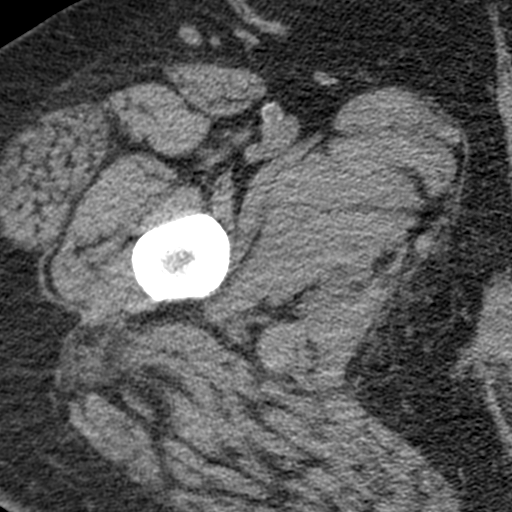
[im 15/77  soft-tissue]
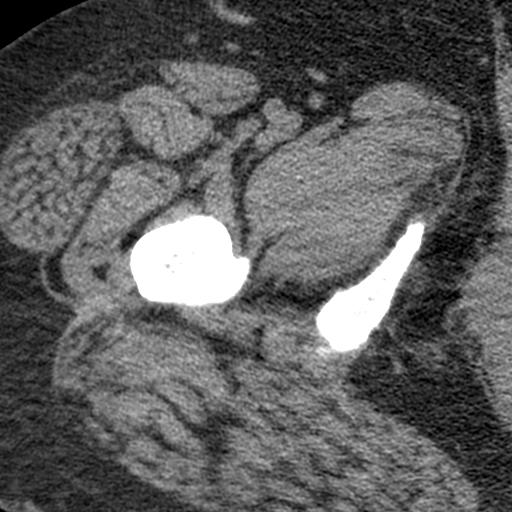
[im 20/77  soft-tissue]
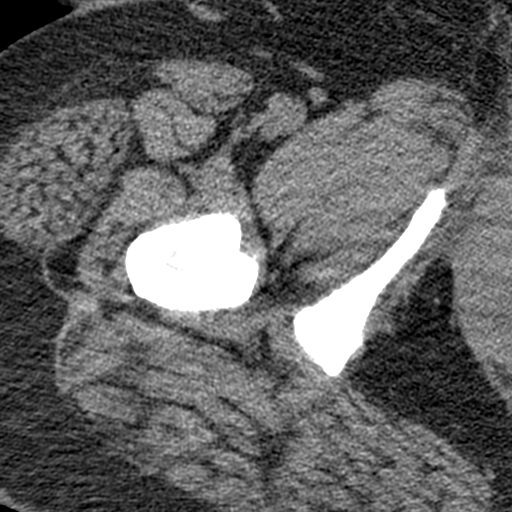
[im 25/77  soft-tissue]
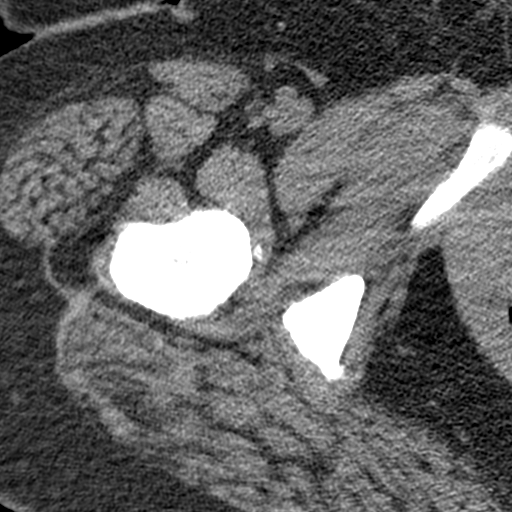
[im 30/77  soft-tissue]
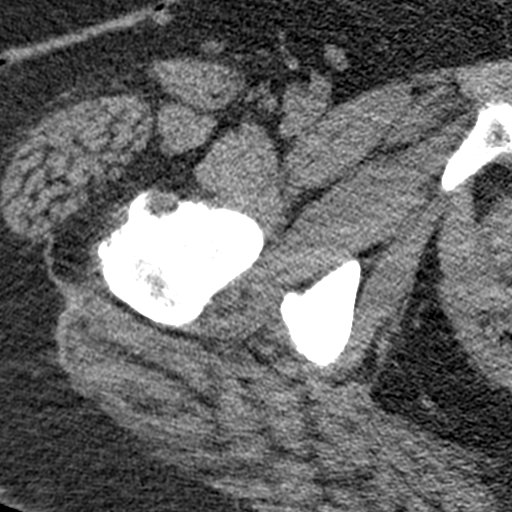
[im 35/77  soft-tissue]
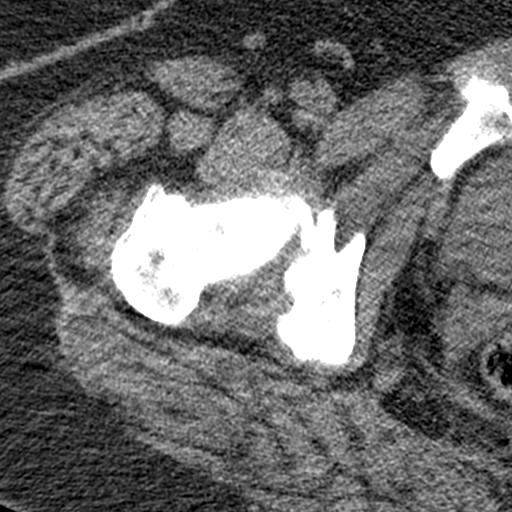
[im 42/77  soft-tissue]
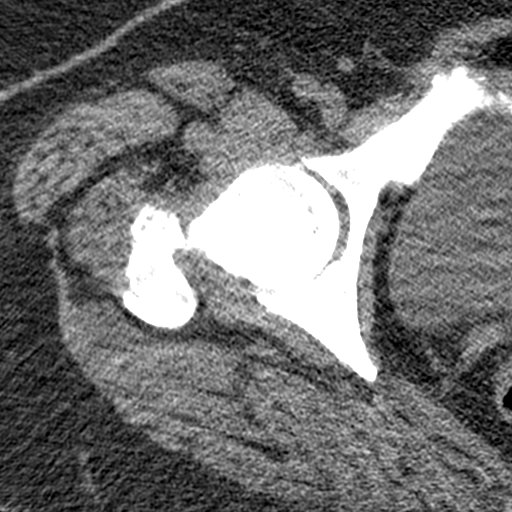
[im 47/77  soft-tissue]
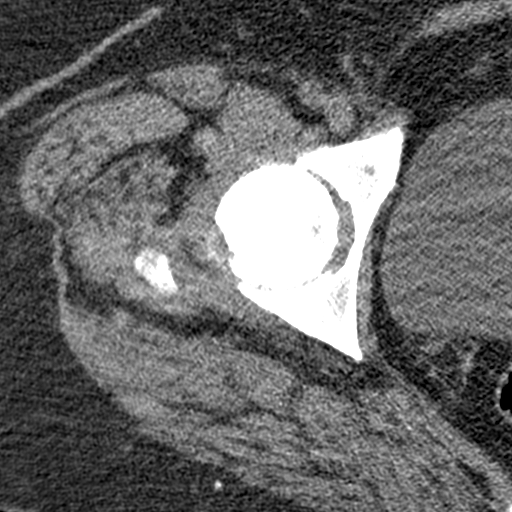
[im 47/77  bone]
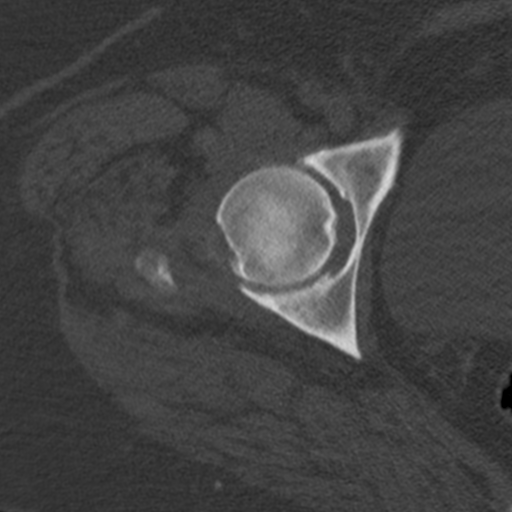
[im 52/77  soft-tissue]
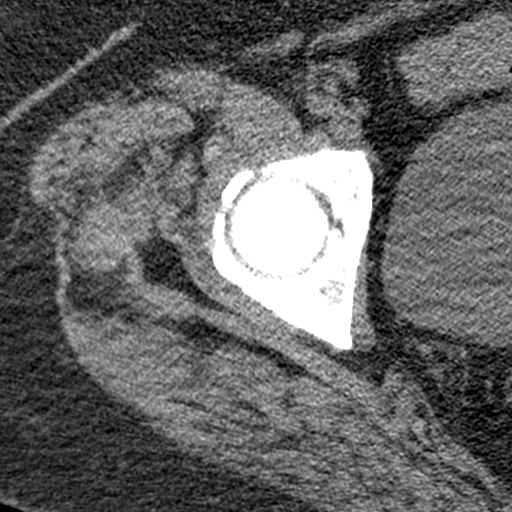
[im 57/77  soft-tissue]
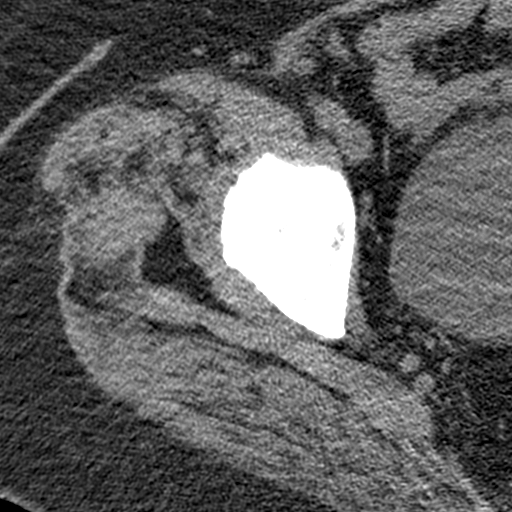
[im 62/77  soft-tissue]
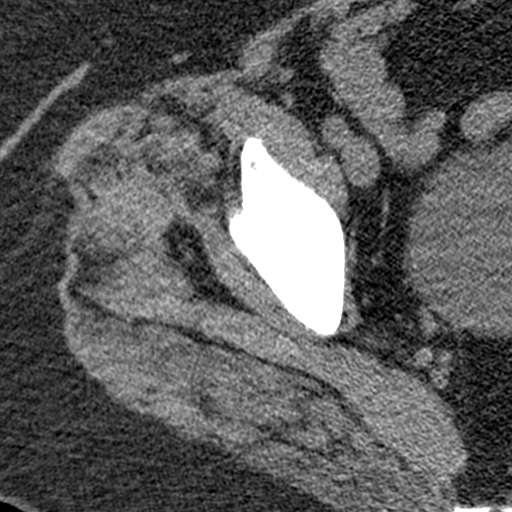
[im 67/77  soft-tissue]
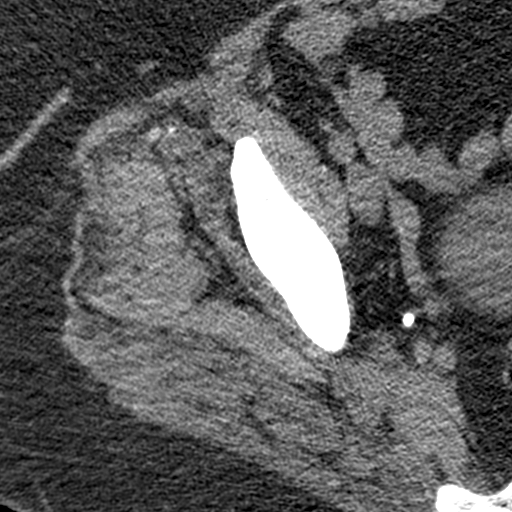
[im 72/77  soft-tissue]
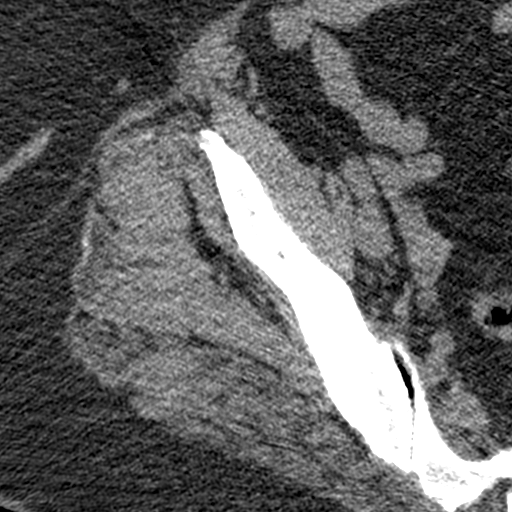

[Series 9: coronal st · coronal · 0.34mm/px · 3 of 68 slices shown]
[im 23/68  soft-tissue]
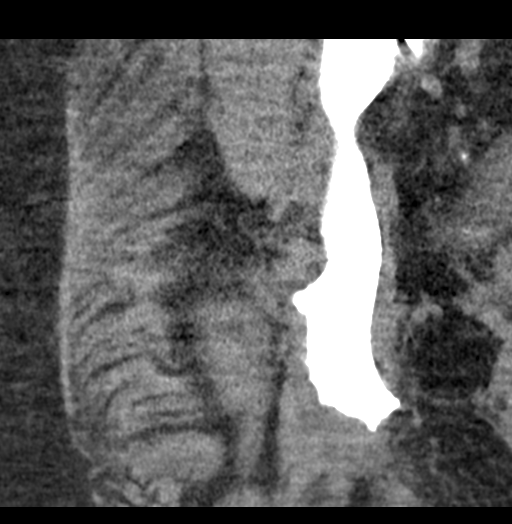
[im 30/68  soft-tissue]
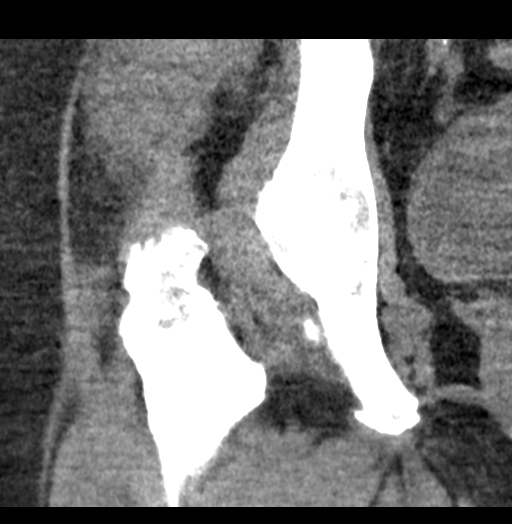
[im 38/68  soft-tissue]
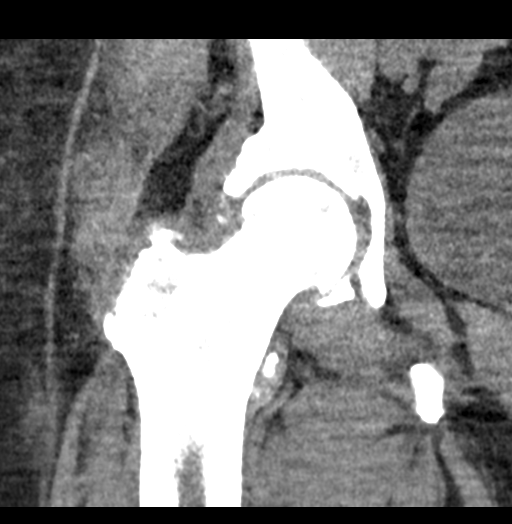

[17 of 46 positions shown; findings below may reference images not displayed]

FINDINGS: Bones/Joint/Cartilage

The bones are well mineralized. There is evidence of a subtle
nondisplaced fracture through base of the greater trochanter, best
seen on the coronal and sagittal images. There is no definite
extension of this fracture across the femoral neck or
intertrochanteric region. There is no dislocation. There are mild to
moderate right hip degenerative changes with fragmented osteophytes.
No significant hip joint effusion. Moderate degenerative changes at
the visualized right sacroiliac joint.

Ligaments

Suboptimally assessed by CT.

Muscles and Tendons

No acute findings. Mild atrophy of the right gluteus musculature. No
gross tendon abnormality identified. There are chronic appearing
calcifications within the soft tissues adjacent to the anterior
superior iliac spine.

Soft tissues

No evidence of periarticular hematoma, other fluid collection,
foreign body or soft tissue emphysema. Mild femoral atherosclerosis.
IMPRESSION: 1. Evidence of nondisplaced fracture through the base of the right
greater trochanter.
2. No fracture extension across the femoral neck or
intertrochanteric region identified.
3. Underlying right hip and sacroiliac degenerative changes.
4. No acute soft tissue findings.

## 2022-11-04 IMAGING — DX DG HIP (WITH OR WITHOUT PELVIS) 2-3V*R*
3 series · 3 of 3 positions shown · non-contrast
Comparison: None available

CLINICAL DATA: Fall.  Pain.  Fall 02/27/2022.  Right hip pain.

EXAM:
DG HIP (WITH OR WITHOUT PELVIS) 2-3V RIGHT

[pelvis ap]
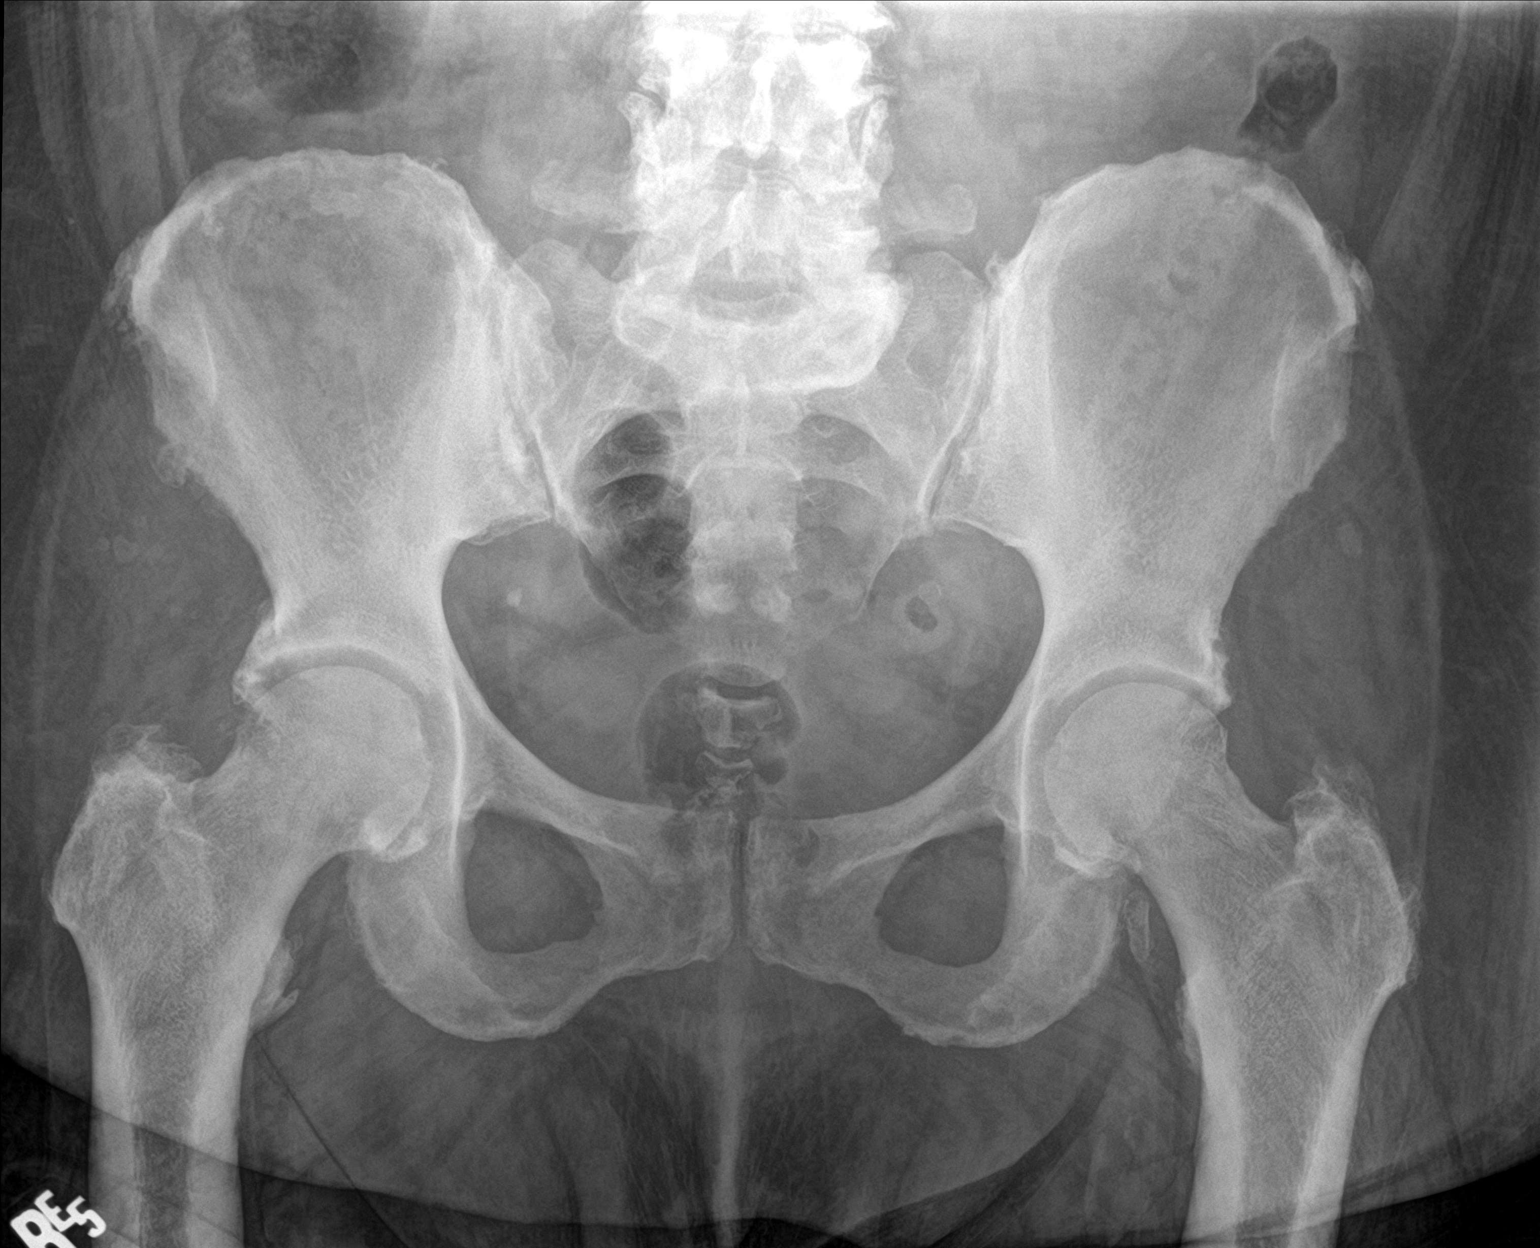

[hip ap]
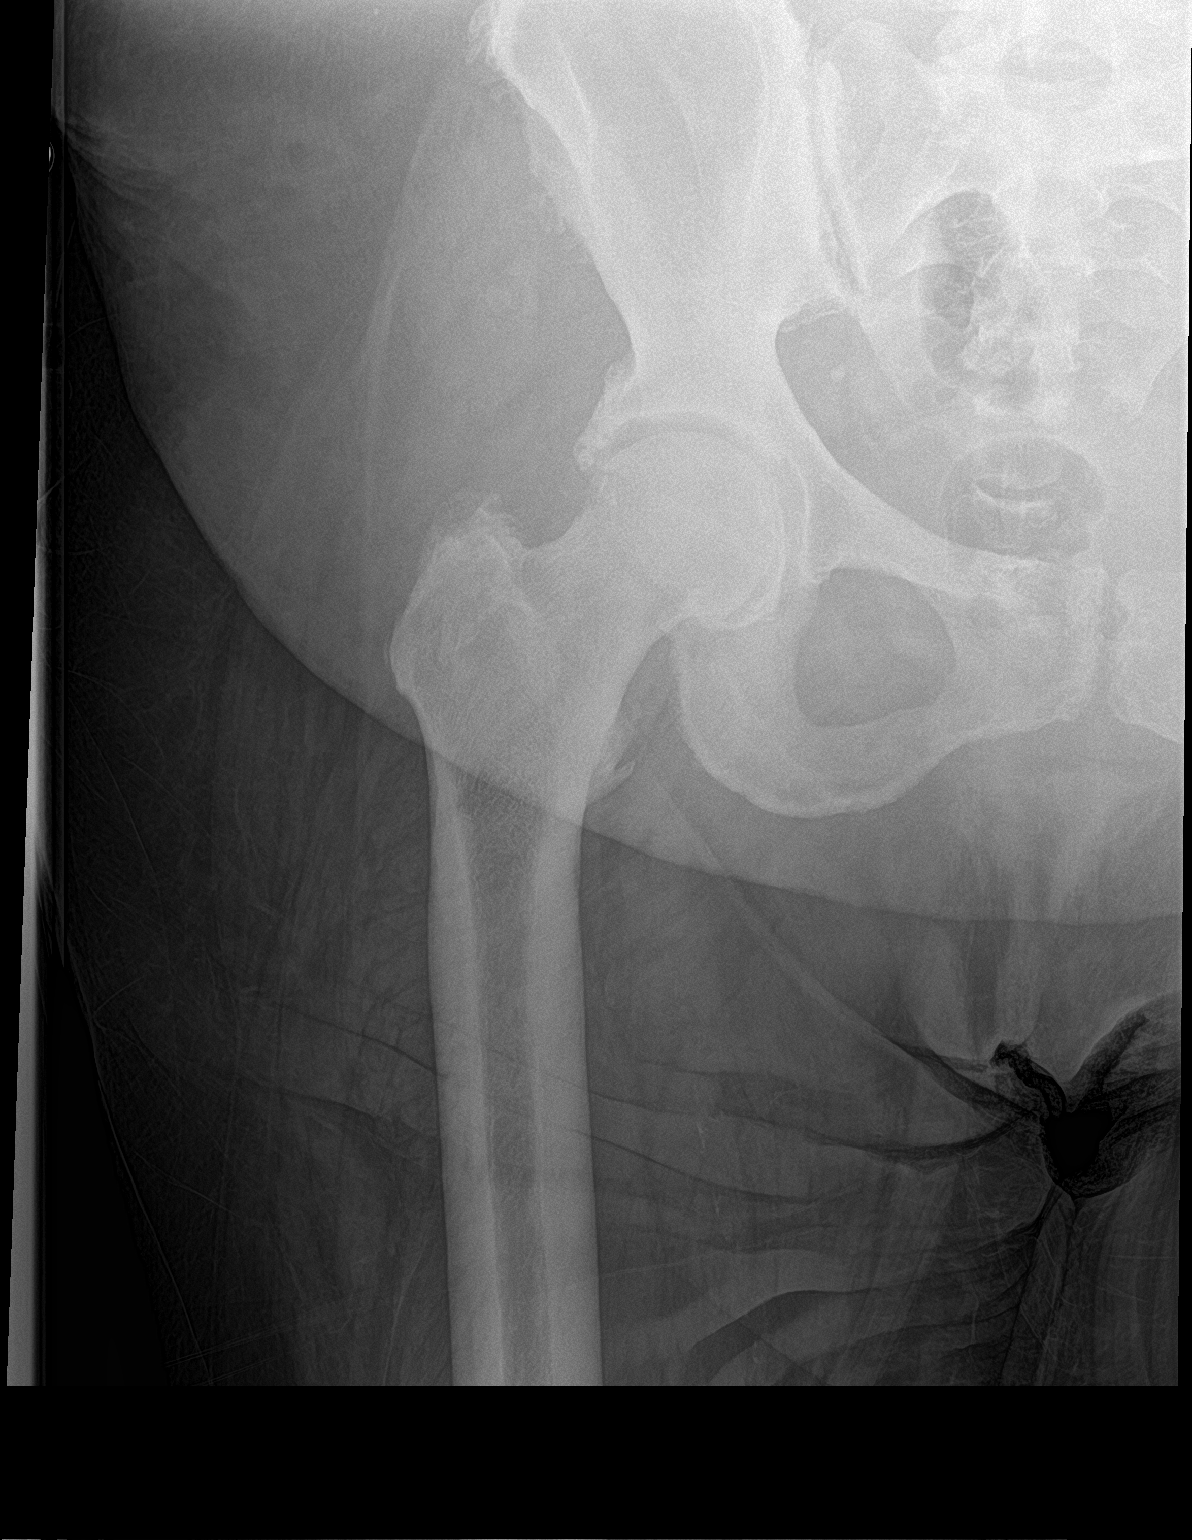

[hip lat]
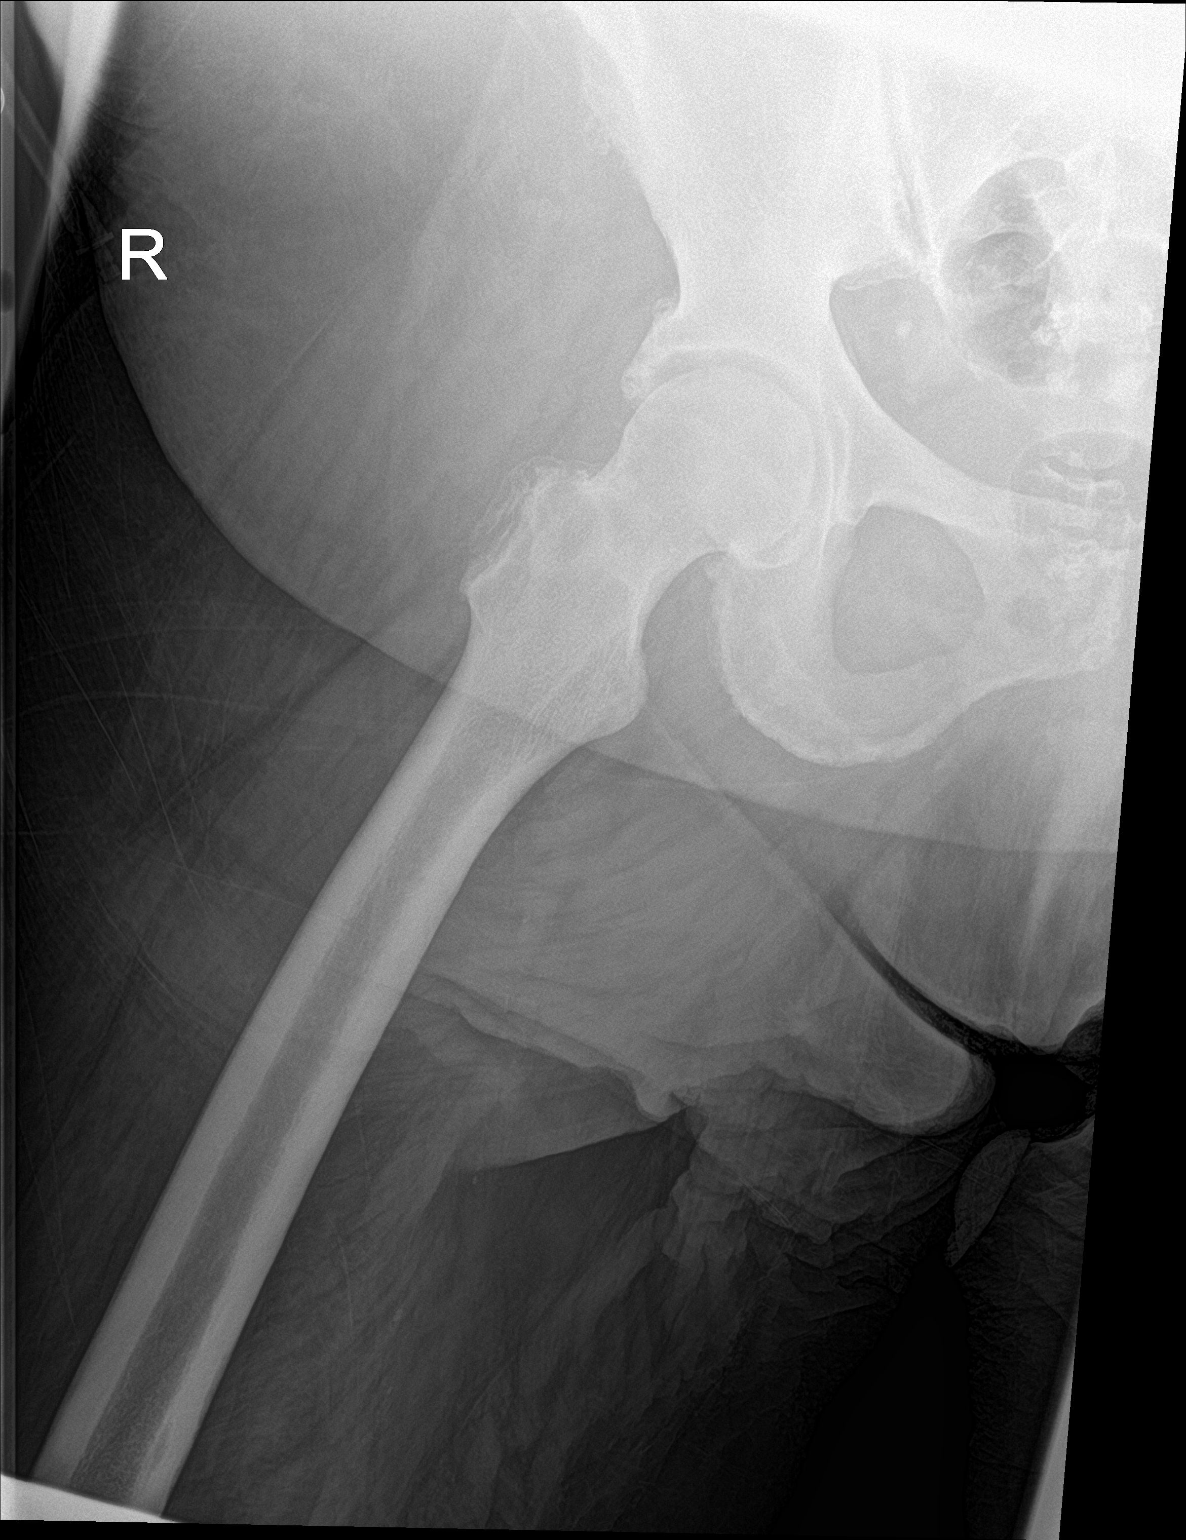

[3 of 3 positions shown; findings below may reference images not displayed]

FINDINGS: There is mildly decreased bone mineralization.

Mild bilateral sacroiliac joint subchondral sclerosis degenerative
change right-greater-than-left.

Mild bilateral femoroacetabular joint space narrowing. Moderate
bilateral acetabular peripheral degenerative osteophytosis.

No acute fracture is seen. No dislocation. There is linear
ossification just inferior to the left femoral head-neck junction,
possibly overlying the iliopsoas tendon but nonspecific.
IMPRESSION: Mild-to-moderate bilateral femoroacetabular osteoarthritis. No
definite acute fracture is seen.
